# Patient Record
Sex: Female | Born: 1950 | Race: White | Hispanic: No | Smoking: Never smoker
Health system: Southern US, Community
[De-identification: ages and names within clinical notes are randomized; demographics above are authoritative.]

## PROBLEM LIST (undated history)

## (undated) DIAGNOSIS — C541 Malignant neoplasm of endometrium: Secondary | ICD-10-CM

## (undated) DIAGNOSIS — C50919 Malignant neoplasm of unspecified site of unspecified female breast: Secondary | ICD-10-CM

## (undated) DIAGNOSIS — I10 Essential (primary) hypertension: Secondary | ICD-10-CM

## (undated) DIAGNOSIS — M1990 Unspecified osteoarthritis, unspecified site: Secondary | ICD-10-CM

## (undated) HISTORY — DX: Unspecified osteoarthritis, unspecified site: M19.90

## (undated) HISTORY — DX: Essential (primary) hypertension: I10

## (undated) HISTORY — DX: Malignant neoplasm of endometrium: C54.1

## (undated) HISTORY — PX: ABDOMINAL HYSTERECTOMY: SHX81

---

## 2009-02-09 ENCOUNTER — Encounter: Admission: RE | Admit: 2009-02-09 | Discharge: 2009-02-09 | Payer: Self-pay | Admitting: Sports Medicine

## 2010-10-20 ENCOUNTER — Ambulatory Visit: Payer: Self-pay | Admitting: Gynecologic Oncology

## 2010-10-22 ENCOUNTER — Ambulatory Visit: Payer: Self-pay | Admitting: Internal Medicine

## 2010-11-05 ENCOUNTER — Ambulatory Visit: Payer: Self-pay | Admitting: Gynecologic Oncology

## 2010-11-06 LAB — CA 125: CA 125: 28.6 U/mL (ref 0.0–34.0)

## 2010-11-19 ENCOUNTER — Ambulatory Visit: Payer: Self-pay | Admitting: Gynecologic Oncology

## 2010-11-19 DIAGNOSIS — C50919 Malignant neoplasm of unspecified site of unspecified female breast: Secondary | ICD-10-CM

## 2010-11-19 HISTORY — DX: Malignant neoplasm of unspecified site of unspecified female breast: C50.919

## 2010-12-20 ENCOUNTER — Ambulatory Visit: Payer: Self-pay | Admitting: Gynecologic Oncology

## 2010-12-25 ENCOUNTER — Ambulatory Visit: Payer: Self-pay | Admitting: Surgery

## 2010-12-25 HISTORY — PX: BREAST BIOPSY: SHX20

## 2011-01-03 ENCOUNTER — Ambulatory Visit: Payer: Self-pay | Admitting: Surgery

## 2011-01-13 ENCOUNTER — Ambulatory Visit: Payer: Self-pay | Admitting: Surgery

## 2011-01-13 HISTORY — PX: BREAST LUMPECTOMY: SHX2

## 2011-01-15 LAB — PATHOLOGY REPORT

## 2011-01-19 ENCOUNTER — Ambulatory Visit: Payer: Self-pay | Admitting: Gynecologic Oncology

## 2011-02-19 ENCOUNTER — Ambulatory Visit: Payer: Self-pay | Admitting: Gynecologic Oncology

## 2011-03-22 ENCOUNTER — Ambulatory Visit: Payer: Self-pay | Admitting: Gynecologic Oncology

## 2011-04-21 ENCOUNTER — Ambulatory Visit: Payer: Self-pay | Admitting: Gynecologic Oncology

## 2011-05-22 ENCOUNTER — Ambulatory Visit: Payer: Self-pay | Admitting: Gynecologic Oncology

## 2011-06-21 ENCOUNTER — Ambulatory Visit: Payer: Self-pay | Admitting: Gynecologic Oncology

## 2011-07-22 ENCOUNTER — Ambulatory Visit: Payer: Self-pay | Admitting: Gynecologic Oncology

## 2011-07-31 LAB — CBC CANCER CENTER
Basophil %: 0.1 %
HCT: 42.6 % (ref 35.0–47.0)
Lymphocyte #: 0.7 x10 3/mm — ABNORMAL LOW (ref 1.0–3.6)
Lymphocyte %: 13.2 %
MCHC: 34.9 g/dL (ref 32.0–36.0)
Monocyte %: 9.9 %
Neutrophil #: 3.6 x10 3/mm (ref 1.4–6.5)
Platelet: 242 x10 3/mm (ref 150–440)
RDW: 13.9 % (ref 11.5–14.5)
WBC: 4.9 x10 3/mm (ref 3.6–11.0)

## 2011-08-22 ENCOUNTER — Ambulatory Visit: Payer: Self-pay | Admitting: Gynecologic Oncology

## 2011-09-25 ENCOUNTER — Ambulatory Visit: Payer: Self-pay | Admitting: Radiation Oncology

## 2011-10-20 ENCOUNTER — Ambulatory Visit: Payer: Self-pay | Admitting: Oncology

## 2011-11-19 ENCOUNTER — Ambulatory Visit: Payer: Self-pay

## 2011-12-25 ENCOUNTER — Ambulatory Visit: Payer: Self-pay | Admitting: Oncology

## 2012-01-14 ENCOUNTER — Ambulatory Visit: Payer: Self-pay | Admitting: Oncology

## 2012-03-09 ENCOUNTER — Ambulatory Visit: Payer: Self-pay | Admitting: Radiation Oncology

## 2012-03-10 LAB — CANCER ANTIGEN 27.29: CA 27.29: 39.2 U/mL — ABNORMAL HIGH (ref 0.0–38.6)

## 2012-03-21 ENCOUNTER — Ambulatory Visit: Payer: Self-pay | Admitting: Radiation Oncology

## 2012-09-28 ENCOUNTER — Ambulatory Visit: Payer: Self-pay | Admitting: Orthopedic Surgery

## 2012-10-04 ENCOUNTER — Inpatient Hospital Stay: Payer: Self-pay | Admitting: Internal Medicine

## 2012-10-04 LAB — COMPREHENSIVE METABOLIC PANEL
Calcium, Total: 9.9 mg/dL (ref 8.5–10.1)
Chloride: 87 mmol/L — ABNORMAL LOW (ref 98–107)
Co2: 30 mmol/L (ref 21–32)
Creatinine: 1.2 mg/dL (ref 0.60–1.30)
EGFR (African American): 56 — ABNORMAL LOW
EGFR (Non-African Amer.): 48 — ABNORMAL LOW

## 2012-10-04 LAB — BASIC METABOLIC PANEL
BUN: 15 mg/dL (ref 7–18)
Calcium, Total: 9.5 mg/dL (ref 8.5–10.1)
Chloride: 87 mmol/L — ABNORMAL LOW (ref 98–107)
Glucose: 491 mg/dL — ABNORMAL HIGH (ref 65–99)
Osmolality: 280 (ref 275–301)
Sodium: 128 mmol/L — ABNORMAL LOW (ref 136–145)

## 2012-10-04 LAB — URINALYSIS, COMPLETE
Bilirubin,UR: NEGATIVE
Blood: NEGATIVE
Glucose,UR: >=500 mg/dL (ref 0–75)
Leukocyte Esterase: NEGATIVE
Nitrite: NEGATIVE
Ph: 6 (ref 4.5–8.0)
Protein: NEGATIVE
RBC,UR: 10 /HPF (ref 0–5)
Squamous Epithelial: <1
WBC UR: 15 /HPF (ref 0–5)

## 2012-10-04 LAB — CBC
HCT: 41.6 % (ref 35.0–47.0)
MCH: 32.8 pg (ref 26.0–34.0)
MCV: 97 fL (ref 80–100)

## 2012-10-04 LAB — PROTIME-INR: INR: 1.1

## 2012-10-05 LAB — BASIC METABOLIC PANEL
BUN: 13 mg/dL (ref 7–18)
Chloride: 92 mmol/L — ABNORMAL LOW (ref 98–107)
Creatinine: 0.68 mg/dL (ref 0.60–1.30)
EGFR (African American): >60
EGFR (Non-African Amer.): >60
Potassium: 2.9 mmol/L — ABNORMAL LOW (ref 3.5–5.1)
Sodium: 132 mmol/L — ABNORMAL LOW (ref 136–145)

## 2012-10-06 LAB — BASIC METABOLIC PANEL
Co2: 28 mmol/L (ref 21–32)
Creatinine: 0.62 mg/dL (ref 0.60–1.30)
EGFR (Non-African Amer.): >60
Glucose: 248 mg/dL — ABNORMAL HIGH (ref 65–99)
Osmolality: 276 (ref 275–301)
Sodium: 134 mmol/L — ABNORMAL LOW (ref 136–145)

## 2012-10-09 LAB — PLATELET COUNT: Platelet: 325 10*3/uL (ref 150–440)

## 2013-02-08 ENCOUNTER — Ambulatory Visit: Payer: Self-pay | Admitting: Internal Medicine

## 2013-02-18 ENCOUNTER — Ambulatory Visit: Payer: Self-pay | Admitting: Internal Medicine

## 2013-04-12 ENCOUNTER — Ambulatory Visit: Payer: Self-pay | Admitting: Oncology

## 2013-04-20 ENCOUNTER — Ambulatory Visit: Payer: Self-pay | Admitting: Oncology

## 2013-04-25 ENCOUNTER — Ambulatory Visit: Payer: Self-pay | Admitting: Oncology

## 2013-04-25 HISTORY — PX: BREAST BIOPSY: SHX20

## 2013-10-10 ENCOUNTER — Ambulatory Visit: Payer: Self-pay | Admitting: Oncology

## 2013-11-22 ENCOUNTER — Ambulatory Visit: Payer: Self-pay | Admitting: Oncology

## 2013-12-19 ENCOUNTER — Ambulatory Visit: Payer: Self-pay | Admitting: Oncology

## 2014-05-25 ENCOUNTER — Ambulatory Visit: Payer: Self-pay | Admitting: Oncology

## 2014-06-12 IMAGING — US US EXTREM LOW VENOUS*R*
1 series · 14 of 23 positions shown · non-contrast
Comparison: none

REASON FOR EXAM: CALL REPORT [id number] ASK FOR KRUPP swelling red warm to
touch eval DVT   FAX...
COMMENTS:

[Series 1: us extrem low venous*right* · 0.13mm/px · 14 of 23 slices shown]
[im 1/23]
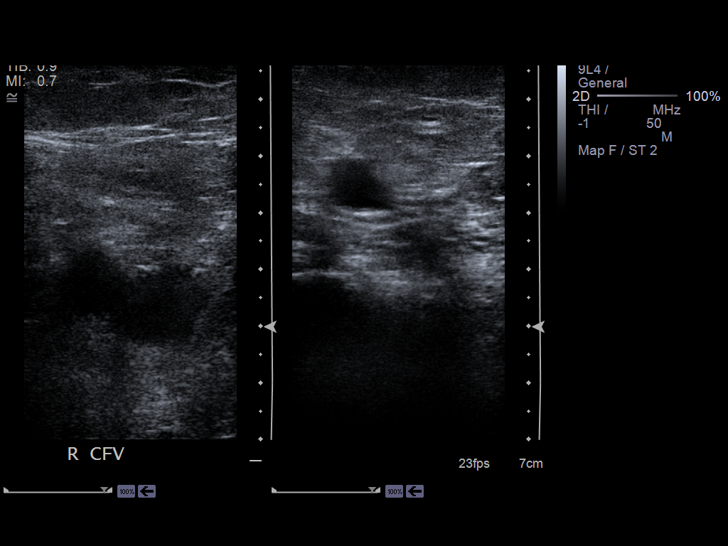
[im 3/23]
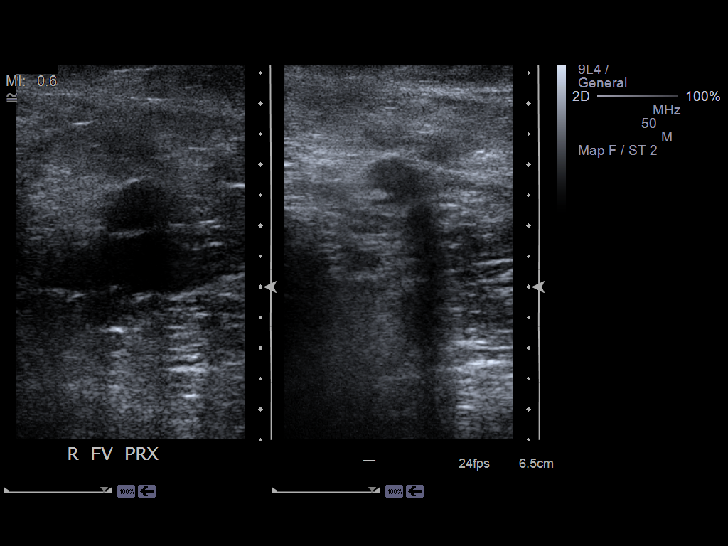
[im 5/23]
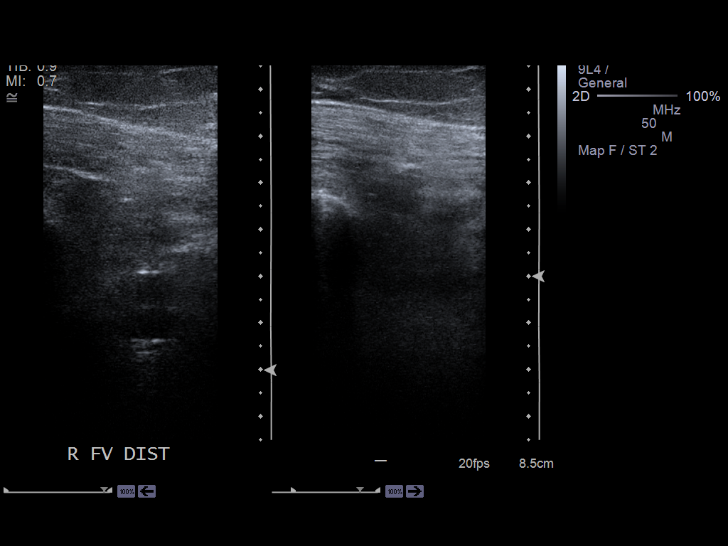
[im 6/23]
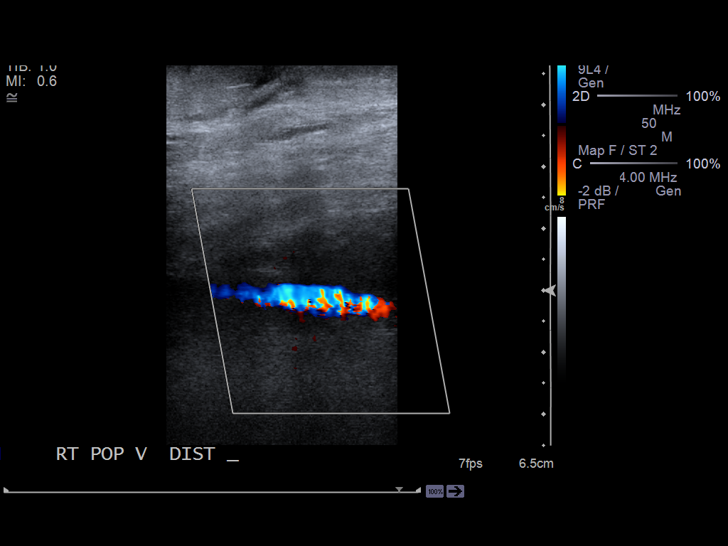
[im 8/23]
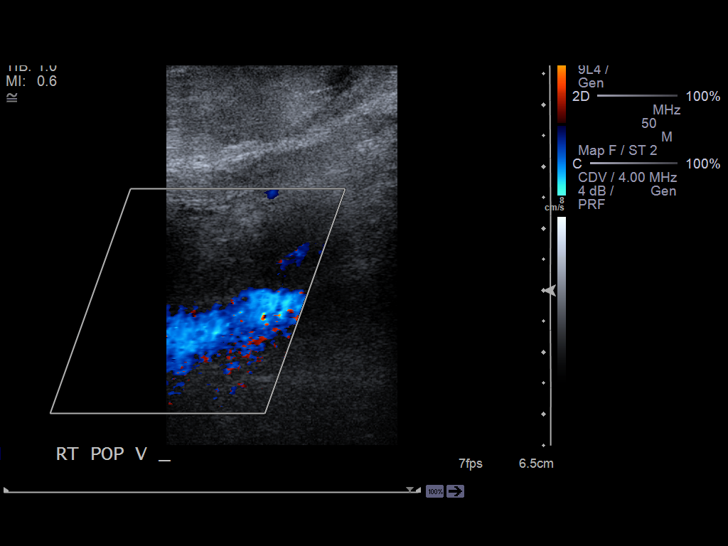
[im 10/23]
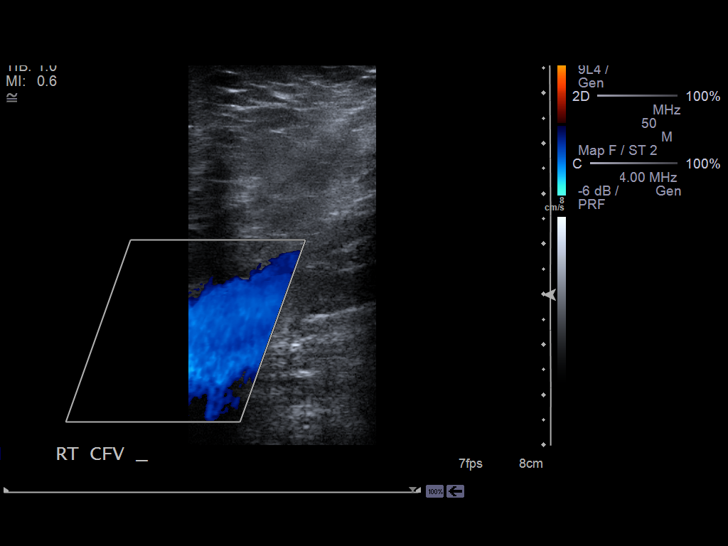
[im 11/23]
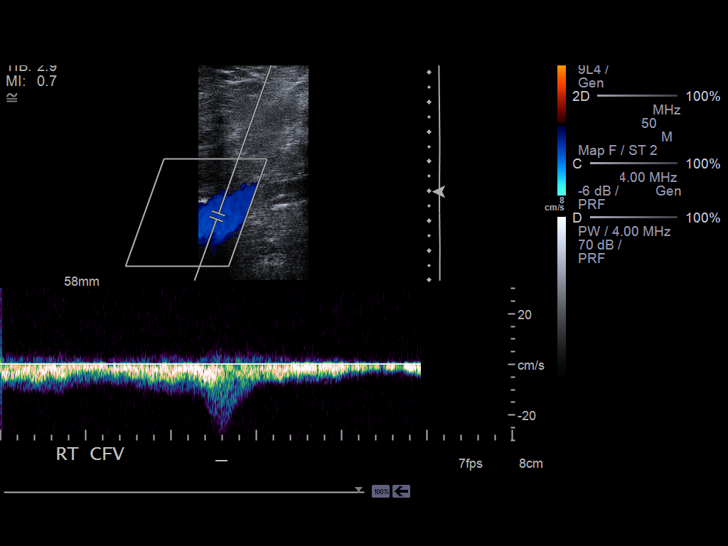
[im 13/23]
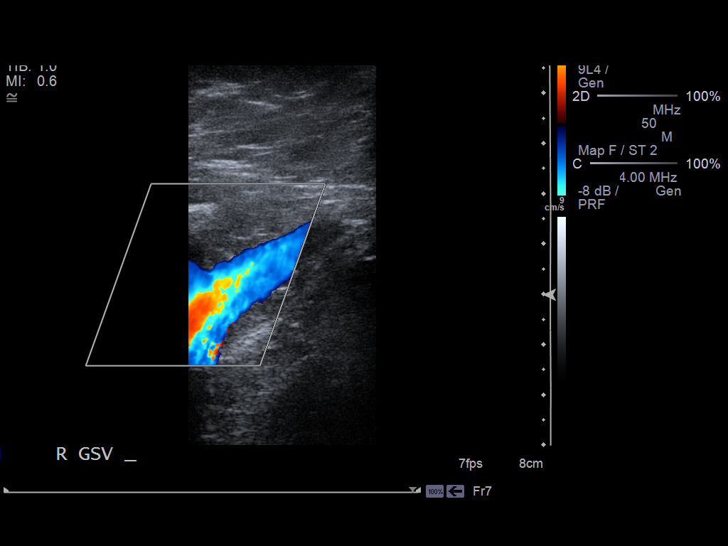
[im 14/23]
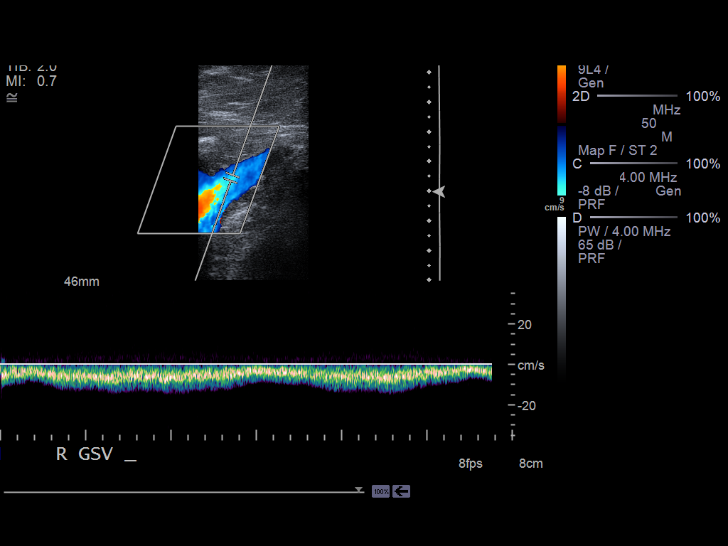
[im 16/23]
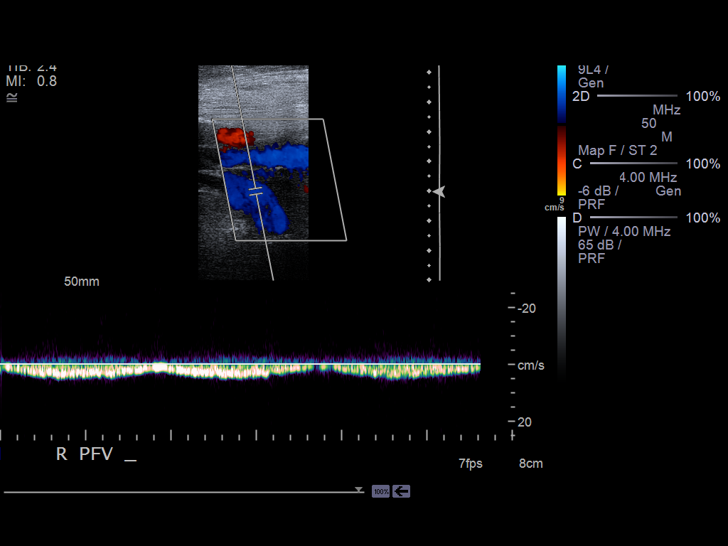
[im 18/23]
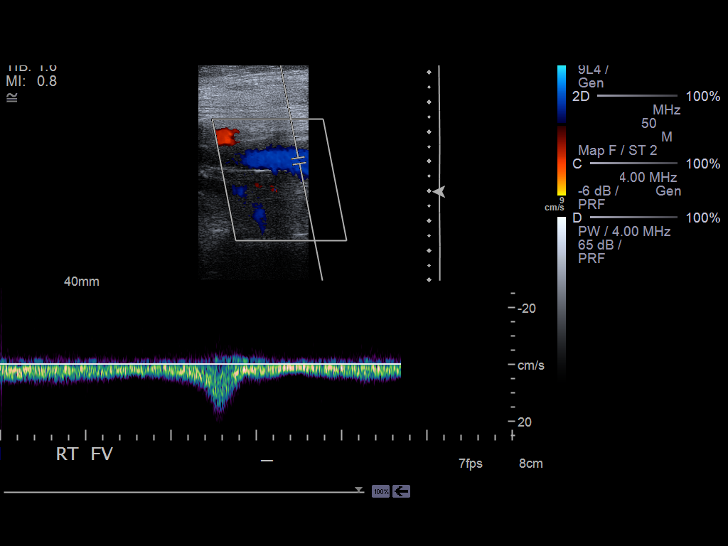
[im 19/23]
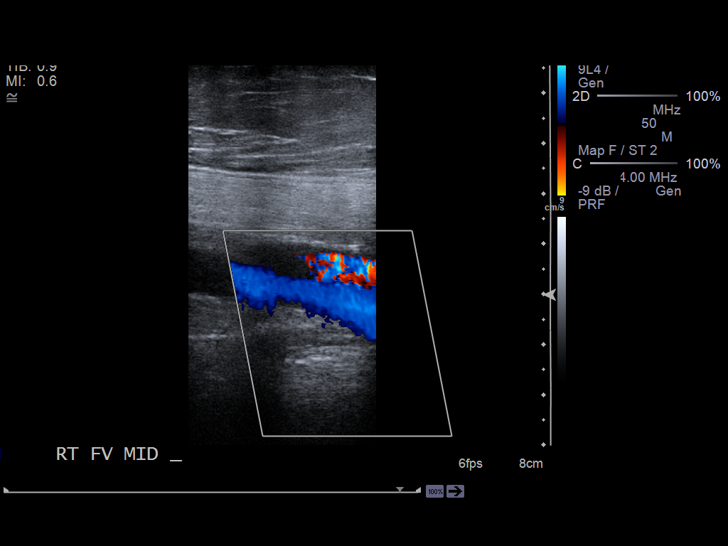
[im 21/23]
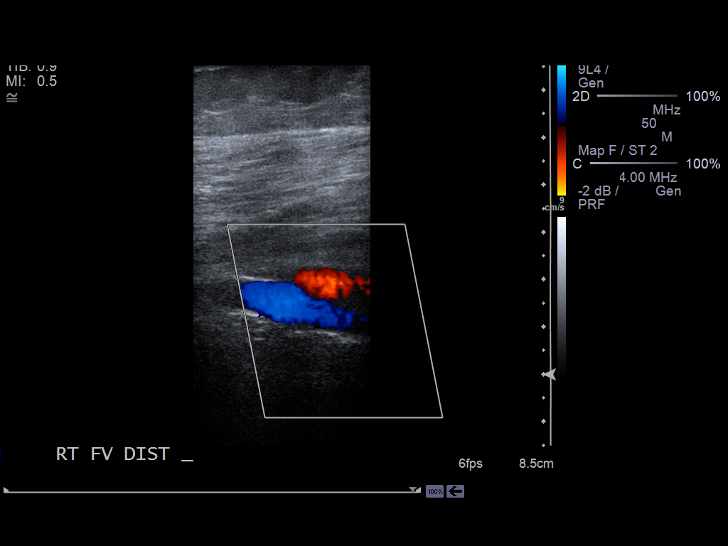
[im 23/23]
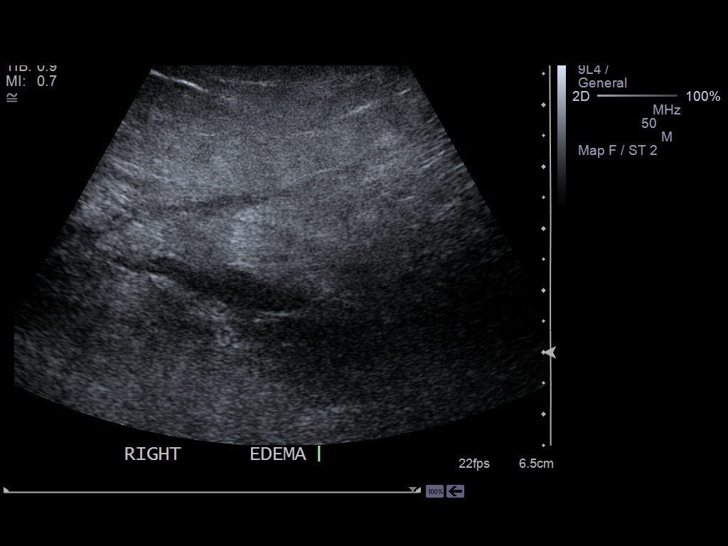

[14 of 23 positions shown; findings below may reference images not displayed]

PROCEDURE:     US  - US DOPPLER LOW EXTR RIGHT  - September 28, 2012  [DATE]

RESULT:     Doppler interrogation of the deep venous system of the right leg
is performed from the common femoral vein through the popliteal vein. The
study shows normal compressibility. The color and spectral Doppler
appearance is normal. There is no abnormal fluid collection.
IMPRESSION: 1. No evidence of right lower extremity deep vein thrombosis.

[REDACTED]

## 2014-11-10 NOTE — H&P (Signed)
PATIENT NAME:  Tina, SIMMERMAN MR#:  [id number] DATE OF BIRTH:  18-Apr-1951  DATE OF ADMISSION:  10/04/2012  PRIMARY CARE PHYSICIAN:  Valentino Saxon, MD  CHIEF COMPLAINT: Dry mouth, increased urination and right leg redness.   HISTORY OF PRESENT ILLNESS: Tina Jacobson is a 64 year old morbidly obese Caucasian female with past medical history of endometrial carcinoma, history of right breast cancer, hypertension and morbid obesity who comes to the Emergency Room accompanied by her son with complaints of increased urination and severe dry mouth with increased fluid intake for the last several days. The patient initially started about 2 weeks ago with a toothache which got resolved and thereafter started on and off with right leg pain and right leg redness. Her redness worsened, more on the right side, about 2 to 3 days ago. She did have some mild fever, not documented at home, comes to the Emergency Room where she was found to be hyperglycemic with blood sugar in the 700s. She was diagnosed with new onset type 2 diabetes and she has a right lower extremity cellulitis and a small cellulitis developing on the left lower extremity. She is being admitted with SIRS secondary to cellulitis and new onset type 2 diabetes. The patient received a dose of IV Rocephin, received some IV fluids and IV insulin. She does not have DKA. She is being admitted for further evaluation and management.   PAST MEDICAL HISTORY: 1.  Hypertension.  2.  Breast cancer, status post right-sided mastectomy and status post radiation.  3.  Endometrial carcinoma status post total abdominal hysterectomy with radiation therapy. The patient says she has been cancer free.   MEDICATIONS: 1.  Metoprolol succinate 12.5 mg p.o. daily.  2.  Letrozole 2.5 mg daily.  3.  Hydrochlorothiazide/triamterene 25/37.5 mg 1 tablet p.o. daily.  4.  Caltrate 1 tablet daily.  5.  CoQ10 p.o. daily.  6.  Multivitamin p.o. daily.   FAMILY HISTORY:  Positive for diabetes on father's side. There is heart disease in the family.    SOCIAL HISTORY: Lives at home. No smoking or use of alcohol.   REVIEW OF SYSTEMS: CONSTITUTIONAL: Positive for fatigue, weakness and fever.  EYES: No blurred or double vision or cataracts.  ENT: No tinnitus, ear pain or hearing loss.  RESPIRATORY: No cough, wheeze, hemoptysis or COPD. CARDIOVASCULAR: No chest pain, orthopnea or edema. Positive for hypertension.  GASTROINTESTINAL: No nausea, vomiting, diarrhea or abdominal pain.  GENITOURINARY: No dysuria or hematuria. ENDOCRINE: Positive for polyuria, increased thirst. No thyroid problems.  HEMATOLOGY: No anemia or easy bruising.  SKIN: Positive for right lower extremity rash, erythema.  MUSCULOSKELETAL: Positive for knee pain.  NEUROLOGIC: No CVA, TIA or dementia.  PSYCH: No anxiety, depression or bipolar disorder. All systems reviewed and negative.   PHYSICAL EXAMINATION: GENERAL: The patient is morbidly obese, about 300 pounds, not in acute distress.  VITAL SIGNS: She is afebrile. Her pulse is 116, respirations are 18, blood pressure is 122/78 and pulse ox is 94% on room air.  HEENT: Atraumatic, normocephalic. Pupils are equal, round and reactive to light and accommodation. EOM intact. Oral mucosa is dry.  NECK: Supple. No JVD. No carotid bruit.  LUNGS: Clear to auscultation bilaterally. No rales, rhonchi, respiratory distress or labored breathing.  HEART: Both the heart sounds are normal. Tachycardia present. No murmur heard. PMI not lateralized. Chest nontender.  EXTREMITIES:  The patient has puffy legs bilaterally with 1+ pitting edema. Right lower extremity has an extensive cellulitis in her right tibial  shin along with left lower extremity mild cellulitis developing on the left lateral aspect of the tibial shin.  ABDOMEN: Obese, soft and nontender. No organomegaly.  NEUROLOGIC: Grossly intact cranial nerves II through XII. No motor or sensory  deficits.  PSYCHIATRIC: The patient is awake, alert and oriented x 3.  SKIN: Warm and dry. Cellulitis in the right lower extremity, as described above.   LABORATORY AND DIAGNOSTICS:  Glucose is 724, BUN 18, creatinine 1.20, sodium 127, potassium 3.3, protein 8.4 and albumin 2.2. CBC is within normal limits. Urinalysis positive for glucosuria and protein is negative.   Ultrasound of lower extremity on the right is negative for DVT.   ASSESSMENT: 64 year old Tina Jacobson with morbid obesity who comes in with:  1.  Systemic antiinflammatory response syndrome due to cellulitis of right lower leg and developing in the left lower leg extremity as well. The patient will be admitted on off-unit medical floor. She will be started on IV cephazolin around the clock. We will follow blood cultures. Followup regression of the cellulitis. Change antibiotics unless the patient's blood cultures grow any organism.  2.  New onset type 2 diabetes. The patient presented with polyuria, polydipsia.  She does not have diabetic ketoacidosis. We will continue to monitor glucose check every 6 hourly and try to treat it with oral metformin 500 mg b.i.d. along with glipizide, which will be started. I will start high-dose sliding scale. If sugars still remain on the higher side secondary to infection, then I will give her some long acting insulin.  3.  Hypertension. Will continue metoprolol for now. The patient will benefit from low-dose enalapril too. We will monitor blood pressure and add ACE inhibitors prior to discharge, if able to.  4.  Morbid obesity. Lifestyle and exercise explained.  5.  Pseudohyponatremia with dehydration. Appears to be due to uncontrolled sugars. The patient is currently on IV fluids, which I will continue at maintenance dose.  6.  Deep vein thrombosis prophylaxis with heparin subcutaneous b.i.d.   Further work-up according to the patient's clinical course. Hospital admission plan was discussed with  the patient and the patient's son who was present in the Emergency Room.   TIME SPENT: 55 minutes.  ____________________________ Hart Rochester Posey Pronto, MD sap:sb D: 10/04/2012 13:49:54 ET T: 10/04/2012 14:15:46 ET JOB#: [id number]  cc: Alquan Morrish A. Posey Pronto, MD, <Dictator> Maureen P. Heber [place], MD Ilda Basset MD ELECTRONICALLY SIGNED 10/16/2012 13:06

## 2014-11-10 NOTE — Discharge Summary (Signed)
PATIENT NAME:  Tina Jacobson, MONDOR MR#:  [id number] DATE OF BIRTH:  1951-05-08  DATE OF ADMISSION:  10/04/2012 DATE OF DISCHARGE:  10/09/2012  PRESENTING COMPLAINT: Dry mouth, increased urination and right leg redness.   DISCHARGE DIAGNOSES:  1.  Right lower extremity cellulitis.  2.  New onset type 2 diabetes.  3.  Morbid obesity.  4.  Hypertension.   CODE STATUS: Full code.   CONDITION ON DISCHARGE: Fair. Vitals stable.   MEDICATIONS:  1.  Hydrochlorothiazide/triamterene 1 tablet daily.  2.  Letrozole 2.5 mg daily.  3.  Motrin 200 mg 1 tablet b.i.d. as needed.  4.  Metoprolol 25 mg extended release 1/2 tablet daily at bedtime.  5.  Glipizide 10 mg b.i.d.  6.  Metformin 1000 mg b.i.d.  7.  Keflex 500 t.i.d. for remaining days as prescribed.  8.  Calcium with vitamin D 1 tablet daily.  9.  Multivitamin daily.  10. Ultram 50 mg, 1 tablet every 4 hours as needed.   Home health PT.   DIET: Diabetic diet, 1800 calorie, low sodium.   FOLLOWUP: Follow up with Dr. Heber  in 1 to 2 weeks. The patient will be followed up with [facility] for outpatient diabetes management.   LABS AT DISCHARGE: Glucose is 116. Platelet count 325. Ultrasound Doppler right lower extremity with no clot. Creatinine 0.62, sodium 134, potassium 3.6, chloride 97, bicarbonate 28. Hemoglobin A1c is 13.0. Magnesium 2.0. Blood cultures negative in 24 hours. Blood glucose on admission was 724, anion gap was normal. CBC within normal limits. Urinalysis negative for urinary tract infection.  BRIEF SUMMARY OF HOSPITAL COURSE: The patient is a very pleasant, morbidly obese, 64 year old, Caucasian female with history of hypertension, history of breast cancer, history of endometrial cancer, who comes to the hospital due to right lower extremity redness and noted to have cellulitis along with new onset diabetes.  1.  She was admitted with SIRS, which was likely due to her right lower extremity cellulitis. The patient  was given IV antibiotics. She was started on IV Keflex and remained afebrile and remained hemodynamically stable. Her blood cultures remained negative. She was changed to p.o. Keflex, she has completed treatment for a total of 10 days.  2.  New onset diabetes. The patient presented with symptoms of polyuria, polydipsia, and had blood sugar of greater than 700 with normal anion gap. Sugars improved after she was started on metformin, glipizide and sliding scale. She was seen by diabetic inpatient [facility] and was educated regarding type 2 diabetes. She will follow with them as outpatient. Referral has been made for outpatient followup.  3.  Hypertension. Toprol was continued.  4.  Hyponatremia. Resolves since blood sugars have improved. It appeared to be pseudohyponatremia secondary to elevated sugars.  4.  Hypokalemia, resolved with p.o. supplementation.   Physical therapy and home health R.N. has been set up. Hospital stay otherwise remained stable.   CODE STATUS: The patient remained a full code.   TIME SPENT: 40 minutes.    ____________________________ Hart Rochester Posey Pronto, MD sap:aw D: 10/10/2012 07:11:08 ET T: 10/10/2012 08:43:54 ET JOB#: [id number]  cc: Jayelle Page A. Posey Pronto, MD, <Dictator> Maureen P. Heber , MD Ilda Basset MD ELECTRONICALLY SIGNED 10/16/2012 13:06

## 2014-12-14 ENCOUNTER — Encounter: Payer: Self-pay | Admitting: Oncology

## 2014-12-14 ENCOUNTER — Inpatient Hospital Stay: Payer: Medicare Other | Attending: Oncology | Admitting: Oncology

## 2014-12-14 ENCOUNTER — Encounter (INDEPENDENT_AMBULATORY_CARE_PROVIDER_SITE_OTHER): Payer: Self-pay

## 2014-12-14 VITALS — BP 155/89 | HR 97 | Temp 98.3°F | Resp 20 | Wt 303.1 lb

## 2014-12-14 DIAGNOSIS — I10 Essential (primary) hypertension: Secondary | ICD-10-CM | POA: Diagnosis not present

## 2014-12-14 DIAGNOSIS — Z170 Estrogen receptor positive status [ER+]: Secondary | ICD-10-CM | POA: Insufficient documentation

## 2014-12-14 DIAGNOSIS — M1990 Unspecified osteoarthritis, unspecified site: Secondary | ICD-10-CM

## 2014-12-14 DIAGNOSIS — Z8542 Personal history of malignant neoplasm of other parts of uterus: Secondary | ICD-10-CM | POA: Insufficient documentation

## 2014-12-14 DIAGNOSIS — C50911 Malignant neoplasm of unspecified site of right female breast: Secondary | ICD-10-CM | POA: Insufficient documentation

## 2014-12-14 DIAGNOSIS — Z79811 Long term (current) use of aromatase inhibitors: Secondary | ICD-10-CM | POA: Diagnosis not present

## 2014-12-14 DIAGNOSIS — Z79899 Other long term (current) drug therapy: Secondary | ICD-10-CM

## 2014-12-14 DIAGNOSIS — Z923 Personal history of irradiation: Secondary | ICD-10-CM | POA: Diagnosis not present

## 2014-12-14 DIAGNOSIS — Z90710 Acquired absence of both cervix and uterus: Secondary | ICD-10-CM | POA: Insufficient documentation

## 2014-12-14 MED ORDER — LETROZOLE 2.5 MG PO TABS: 2.5000 mg | ORAL_TABLET | Freq: Every day | ORAL | Status: DC

## 2015-01-01 DIAGNOSIS — C50211 Malignant neoplasm of upper-inner quadrant of right female breast: Secondary | ICD-10-CM | POA: Insufficient documentation

## 2015-01-01 NOTE — Progress Notes (Signed)
[place]  [phone] [phone]) [phone]  ID: Tina Jacobson OB: [birth year]  MR#: [id number]  AOZ#:[id number]  Patient Care Team: Gayland Curry, MD as PCP - General (Family Medicine)  CHIEF COMPLAINT:  Chief Complaint  Patient presents with  . Follow-up    breast cancer/endometrial cancer    INTERVAL HISTORY: Patient returns to clinic for routine six-month followup.  She continues to take letrozole without significant side effects, although does complain of occasional hot flashes.  She has no neurologic complaints.  She denies any recent fevers.  She has a good appetite and denies weight loss.  She has no chest pain or shortness of breath.  She denies any nausea, vomiting, constipation, or diarrhea.  Patient offers no further specific complaints today.   REVIEW OF SYSTEMS:   Review of Systems  Constitutional: Negative.   Cardiovascular: Negative.   Musculoskeletal: Negative.     As per HPI. Otherwise, a complete review of systems is negatve.  PAST MEDICAL HISTORY: Past Medical History  Diagnosis Date  . Hypertension   . Arthritis   . Endometrial carcinoma     PAST SURGICAL HISTORY: Past Surgical History  Procedure Laterality Date  . Abdominal hysterectomy      FAMILY HISTORY No family history on file.     ADVANCED DIRECTIVES:    HEALTH MAINTENANCE: History  Substance Use Topics  . Smoking status: Never Smoker   . Smokeless tobacco: Never Used  . Alcohol Use: No     Colonoscopy:  PAP:  Bone density:  Lipid panel:  Allergies  Allergen Reactions  . Codeine Nausea Only  . Lisinopril Other (See Comments)    Blood pressure dropped to low 02/2010 on a higher dose    Current Outpatient Prescriptions  Medication Sig Dispense Refill  . atorvastatin (LIPITOR) 10 MG tablet   10  . Calcium Carbonate-Vitamin D 600-400 MG-UNIT per tablet Take by mouth.    . letrozole (FEMARA) 2.5 MG tablet Take 1 tablet (2.5 mg total) by  mouth daily. 90 tablet 4  . metFORMIN (GLUCOPHAGE) 1000 MG tablet   0  . metoprolol succinate (TOPROL-XL) 25 MG 24 hr tablet   7  . ramipril (ALTACE) 1.25 MG capsule   10  . triamterene-hydrochlorothiazide (MAXZIDE-25) 37.5-25 MG per tablet Take by mouth.     No current facility-administered medications for this visit.    OBJECTIVE: Filed Vitals:   12/14/14 1611  BP: 155/89  Pulse: 97  Temp: 98.3 F (36.8 C)  Resp: 20     There is no height on file to calculate BMI.    ECOG FS:0 - Asymptomatic  General: Well-developed, well-nourished, no acute distress. Eyes: anicteric sclera. Breasts: Bilateral breasts and axilla without lumps or masses. Lungs: Clear to auscultation bilaterally. Heart: Regular rate and rhythm. No rubs, murmurs, or gallops. Abdomen: Soft, nontender, nondistended. No organomegaly noted, normoactive bowel sounds. Musculoskeletal: No edema, cyanosis, or clubbing. Neuro: Alert, answering all questions appropriately. Cranial nerves grossly intact. Skin: No rashes or petechiae noted. Psych: Normal affect.   LAB RESULTS:  Lab Results  Component Value Date   NA 134* 10/06/2012   K 3.6 10/06/2012   CL 97* 10/06/2012   CO2 28 10/06/2012   GLUCOSE 248* 10/06/2012   BUN 10 10/06/2012   CREATININE 0.62 10/06/2012   CALCIUM 8.5 10/06/2012   PROT 8.4* 10/04/2012   ALBUMIN 2.2* 10/04/2012   AST 19 10/04/2012   ALT 23 10/04/2012   ALKPHOS 136 10/04/2012   GFRNONAA >60  10/06/2012   GFRAA >60 10/06/2012    Lab Results  Component Value Date   WBC 7.4 10/04/2012   NEUTROABS 3.6 07/31/2011   HGB 14.1 10/04/2012   HCT 41.6 10/04/2012   MCV 97 10/04/2012   PLT 325 10/09/2012     STUDIES: No results found.  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  PLAN:    1.  Breast cancer: Given patient's Oncotype DX score of 5, she was at low risk for recurrence and did not require adjuvant  chemotherapy.  Continue letrozole daily for 5 years completing in January 2018.  Patient had an abnormal mammogram in October 2015, but biopsy was negative for malignancy. Repeat mammogram in October 2016. Bone mineral density completed on Nov 28, 2013 was reported as normal with a T score of -0.1. Repeat in May 2017. Return toclinic in 6 months for routine followup.   2.  Endometrial cancer: Patient has completed her XRT, monitor.  Continue followup with Gyn-Onc as needed.   Patient expressed understanding and was in agreement with this plan. She also understands that She can call clinic at any time with any questions, concerns, or complaints.   Breast cancer   Staging form: Breast, AJCC 7th Edition     Clinical stage from 01/01/2015: Stage IA (T1c, N0, M0) - Signed by Lloyd Huger, MD on 01/01/2015   Lloyd Huger, MD   01/01/2015 12:16 PM

## 2015-01-04 ENCOUNTER — Ambulatory Visit
Admission: RE | Admit: 2015-01-04 | Discharge: 2015-01-04 | Disposition: A | Discharge status: Home / Self Care | Payer: Medicare Other | Source: Ambulatory Visit | Attending: Oncology | Admitting: Oncology

## 2015-01-04 ENCOUNTER — Ambulatory Visit: Payer: BC Managed Care – PPO

## 2015-01-04 ENCOUNTER — Other Ambulatory Visit: Payer: Self-pay | Admitting: Oncology

## 2015-01-04 DIAGNOSIS — R921 Mammographic calcification found on diagnostic imaging of breast: Secondary | ICD-10-CM | POA: Diagnosis not present

## 2015-01-04 DIAGNOSIS — Z853 Personal history of malignant neoplasm of breast: Secondary | ICD-10-CM | POA: Insufficient documentation

## 2015-01-04 DIAGNOSIS — C50911 Malignant neoplasm of unspecified site of right female breast: Secondary | ICD-10-CM

## 2015-01-04 HISTORY — DX: Malignant neoplasm of unspecified site of unspecified female breast: C50.919

## 2015-06-18 ENCOUNTER — Inpatient Hospital Stay: Payer: Medicare Other | Admitting: Oncology

## 2015-06-28 ENCOUNTER — Inpatient Hospital Stay: Payer: Medicare Other | Admitting: Oncology

## 2015-12-24 ENCOUNTER — Other Ambulatory Visit: Payer: Self-pay | Admitting: *Deleted

## 2015-12-24 DIAGNOSIS — C50911 Malignant neoplasm of unspecified site of right female breast: Secondary | ICD-10-CM

## 2015-12-24 MED ORDER — LETROZOLE 2.5 MG PO TABS: 2.5000 mg | ORAL_TABLET | Freq: Every day | ORAL | Status: DC

## 2016-01-14 ENCOUNTER — Inpatient Hospital Stay: Payer: Medicare Other | Admitting: Oncology

## 2016-01-31 ENCOUNTER — Ambulatory Visit: Payer: Self-pay | Admitting: Oncology

## 2016-02-17 NOTE — Progress Notes (Signed)
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  INTERVAL HISTORY: Patient has not been evaluated in clinic in over 1 year. She has some mild tenderness in her right axilla at the site of her surgery, but otherwise feels well. She continues to tolerate letrozole, but does complain of occasional hot flashes.  She has no neurologic complaints.  She denies any recent fevers.  She has a good appetite and denies weight loss.  She has no chest pain or shortness of breath.  She denies any nausea, vomiting, constipation, or diarrhea.  Patient offers no further specific complaints today.   REVIEW OF SYSTEMS:   Review of Systems  Constitutional: Negative.  Negative for fever, malaise/fatigue and weight loss.  Respiratory: Negative.  Negative for cough and shortness of breath.   Cardiovascular: Negative.  Negative for chest pain.  Gastrointestinal: Negative.  Negative for abdominal pain.  Genitourinary: Negative.   Musculoskeletal: Negative.   Neurological: Negative.  Negative for weakness.  Psychiatric/Behavioral: Negative.     As per HPI. Otherwise, a complete review of systems is negatve.  PAST MEDICAL HISTORY: Past Medical History:  Diagnosis Date  . Arthritis   . Breast cancer 11/2010   radiation and lumpectomy  . Endometrial carcinoma   . Hypertension     PAST SURGICAL HISTORY: Past Surgical History:  Procedure Laterality Date  . ABDOMINAL HYSTERECTOMY    . BREAST BIOPSY Right 04/25/2013   negative, stereotactic  . BREAST BIOPSY Right 12/25/2010   positive, ultrasound core  . BREAST LUMPECTOMY Right 01/13/2011   positive    FAMILY HISTORY Family History    Problem Relation Age of Onset  . Breast cancer Cousin 51    paternal  . Breast cancer Cousin 93    paternal       ADVANCED DIRECTIVES:    HEALTH MAINTENANCE: Social History  Substance Use Topics  . Smoking status: Never Smoker  . Smokeless tobacco: Never Used  . Alcohol use No     Colonoscopy:  PAP:  Bone density:  Lipid panel:  Allergies  Allergen Reactions  . Codeine Nausea Only  . Lisinopril Other (See Comments)    Blood pressure dropped to low 02/2010 on a higher dose    Current Outpatient Prescriptions  Medication Sig Dispense Refill  . atorvastatin (LIPITOR) 10 MG tablet   10  . Calcium Carbonate-Vitamin D 600-400 MG-UNIT per tablet Take by mouth.    . letrozole (FEMARA) 2.5 MG tablet Take 1 tablet (2.5 mg total) by mouth daily. 30 tablet 0  . metFORMIN (GLUCOPHAGE) 1000 MG tablet   0  . metoprolol succinate (TOPROL-XL) 25 MG 24 hr tablet   7  . ramipril (ALTACE) 1.25 MG capsule   10  . triamterene-hydrochlorothiazide (MAXZIDE-25) 37.5-25 MG per tablet Take by mouth.     No current facility-administered medications for this visit.     OBJECTIVE: There were no vitals filed for this visit.   There is no height or weight on file to calculate BMI.    ECOG FS:0 - Asymptomatic  General: Well-developed, well-nourished, no acute distress. Eyes: anicteric sclera. Breasts: Bilateral breasts and axilla without lumps or masses. Lungs: Clear to auscultation bilaterally. Heart: Regular rate and rhythm. No  rubs, murmurs, or gallops. Abdomen: Soft, nontender, nondistended. No organomegaly noted, normoactive bowel sounds. Musculoskeletal: No edema, cyanosis, or clubbing. Neuro: Alert, answering all questions appropriately. Cranial nerves grossly intact. Skin: No rashes or petechiae noted. Psych: Normal affect.   LAB RESULTS:  Lab Results  Component Value Date   NA 134 (L) 10/06/2012   K 3.6 10/06/2012   CL 97 (L) 10/06/2012   CO2 28 10/06/2012   GLUCOSE 248  (H) 10/06/2012   BUN 10 10/06/2012   CREATININE 0.62 10/06/2012   CALCIUM 8.5 10/06/2012   PROT 8.4 (H) 10/04/2012   ALBUMIN 2.2 (L) 10/04/2012   AST 19 10/04/2012   ALT 23 10/04/2012   ALKPHOS 136 10/04/2012   BILITOT 0.6 10/04/2012   GFRNONAA >60 10/06/2012   GFRAA >60 10/06/2012    Lab Results  Component Value Date   WBC 7.4 10/04/2012   NEUTROABS 3.6 07/31/2011   HGB 14.1 10/04/2012   HCT 41.6 10/04/2012   MCV 97 10/04/2012   PLT 325 10/09/2012     STUDIES: No results found.  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  PLAN:    1.  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  Continue letrozole daily for 5 years completing in January 2018.  Patient had an abnormal mammogram in October 2015, but biopsy was negative for malignancy. Patient will require repeat mammogram in the next 1-2 weeks. Return to clinic in 6 months for routine evaluation. 2. Postmenopausal: Bone mineral density completed on Nov 28, 2013 was reported as normal with a T score of -0.1. Repeat the next 1-2 weeks.   2.  Endometrial cancer: Patient has completed her XRT, monitor.  Continue followup with Gyn-Onc as needed.   Patient expressed understanding and was in agreement with this plan. She also understands that She can call clinic at any time with any questions, concerns, or complaints.   Breast cancer   Staging form: Breast, AJCC 7th Edition     Clinical stage from 01/01/2015: Stage IA (T1c, N0, M0) - Signed by Lloyd Huger, MD on 01/01/2015   Lloyd Huger, MD   02/17/2016 3:20 PM

## 2016-02-18 ENCOUNTER — Inpatient Hospital Stay: Payer: Medicare Other | Attending: Oncology | Admitting: Oncology

## 2016-02-18 ENCOUNTER — Encounter: Payer: Self-pay | Admitting: Oncology

## 2016-02-18 VITALS — BP 120/83 | HR 87 | Temp 95.6°F | Ht 61.0 in | Wt 303.6 lb

## 2016-02-18 DIAGNOSIS — Z923 Personal history of irradiation: Secondary | ICD-10-CM | POA: Insufficient documentation

## 2016-02-18 DIAGNOSIS — M1990 Unspecified osteoarthritis, unspecified site: Secondary | ICD-10-CM | POA: Insufficient documentation

## 2016-02-18 DIAGNOSIS — R232 Flushing: Secondary | ICD-10-CM | POA: Diagnosis not present

## 2016-02-18 DIAGNOSIS — C50211 Malignant neoplasm of upper-inner quadrant of right female breast: Secondary | ICD-10-CM | POA: Insufficient documentation

## 2016-02-18 DIAGNOSIS — Z780 Asymptomatic menopausal state: Secondary | ICD-10-CM | POA: Diagnosis not present

## 2016-02-18 DIAGNOSIS — Z79811 Long term (current) use of aromatase inhibitors: Secondary | ICD-10-CM | POA: Insufficient documentation

## 2016-02-18 DIAGNOSIS — M79621 Pain in right upper arm: Secondary | ICD-10-CM | POA: Insufficient documentation

## 2016-02-18 DIAGNOSIS — Z7984 Long term (current) use of oral hypoglycemic drugs: Secondary | ICD-10-CM

## 2016-02-18 DIAGNOSIS — Z79899 Other long term (current) drug therapy: Secondary | ICD-10-CM | POA: Diagnosis not present

## 2016-02-18 DIAGNOSIS — Z90710 Acquired absence of both cervix and uterus: Secondary | ICD-10-CM | POA: Diagnosis not present

## 2016-02-18 DIAGNOSIS — Z170 Estrogen receptor positive status [ER+]: Secondary | ICD-10-CM | POA: Diagnosis not present

## 2016-02-18 DIAGNOSIS — I10 Essential (primary) hypertension: Secondary | ICD-10-CM | POA: Insufficient documentation

## 2016-02-18 DIAGNOSIS — Z8542 Personal history of malignant neoplasm of other parts of uterus: Secondary | ICD-10-CM | POA: Insufficient documentation

## 2016-02-18 DIAGNOSIS — Z803 Family history of malignant neoplasm of breast: Secondary | ICD-10-CM | POA: Diagnosis not present

## 2016-02-18 NOTE — Progress Notes (Signed)
Breast concerns are related to a firm tissue under right arm.  Left breast seems more enlarged than before.  SOB on exertion

## 2016-02-25 ENCOUNTER — Other Ambulatory Visit: Payer: Self-pay | Admitting: *Deleted

## 2016-02-25 DIAGNOSIS — C50911 Malignant neoplasm of unspecified site of right female breast: Secondary | ICD-10-CM

## 2016-02-25 MED ORDER — LETROZOLE 2.5 MG PO TABS: 2.5000 mg | ORAL_TABLET | Freq: Every day | ORAL | 1 refills | Status: DC

## 2016-03-11 ENCOUNTER — Other Ambulatory Visit: Payer: Self-pay | Admitting: *Deleted

## 2016-03-11 DIAGNOSIS — C50911 Malignant neoplasm of unspecified site of right female breast: Secondary | ICD-10-CM

## 2016-03-11 MED ORDER — LETROZOLE 2.5 MG PO TABS: 2.5000 mg | ORAL_TABLET | Freq: Every day | ORAL | 0 refills | Status: DC

## 2016-03-12 ENCOUNTER — Other Ambulatory Visit: Payer: Medicare Other

## 2016-03-12 ENCOUNTER — Ambulatory Visit: Payer: Medicare Other

## 2016-03-18 ENCOUNTER — Ambulatory Visit
Admission: RE | Admit: 2016-03-18 | Discharge: 2016-03-18 | Disposition: A | Discharge status: Home / Self Care | Payer: Medicare Other | Source: Ambulatory Visit | Attending: Oncology | Admitting: Oncology

## 2016-03-18 DIAGNOSIS — C50211 Malignant neoplasm of upper-inner quadrant of right female breast: Secondary | ICD-10-CM | POA: Insufficient documentation

## 2016-03-18 DIAGNOSIS — Z79818 Long term (current) use of other agents affecting estrogen receptors and estrogen levels: Secondary | ICD-10-CM | POA: Insufficient documentation

## 2016-03-18 DIAGNOSIS — Z780 Asymptomatic menopausal state: Secondary | ICD-10-CM | POA: Insufficient documentation

## 2016-03-31 ENCOUNTER — Encounter: Payer: Self-pay | Admitting: *Deleted

## 2016-03-31 NOTE — Progress Notes (Signed)
Called patient per Dr. Gary Fleet request.  Patient's mammogram with birads 0, recommending a CT or MRI for further evaluation of an axillary lipoma like lesion.  Discussed results with patient.  States the thickness in the axilla has been present since her partial mastectomy.  She does not believe anything has changed.  States Dr. Joneen Caraway reviewed possibilty of "cancer or pre-cancer" might show up on the scans.  States she is confident in waiting 6 months for her follow-up exam with Dr. Grayland Ormond.  She will continue with monthly self exam and will call if any new findings or changes.  She is to call with any questions or needs.    Oncology Nurse Navigator Documentation  Navigator Location: [facility] (03/31/16 1000) Navigator Encounter Type: Telephone (03/31/16 1000) Telephone: Outgoing Call (03/31/16 1000)                                        Time Spent with Patient: 30 (03/31/16 1000)

## 2016-06-16 ENCOUNTER — Other Ambulatory Visit: Payer: Self-pay | Admitting: *Deleted

## 2016-06-16 MED ORDER — LETROZOLE 2.5 MG PO TABS: 2.5000 mg | ORAL_TABLET | Freq: Every day | ORAL | 0 refills | Status: DC

## 2016-08-24 NOTE — Progress Notes (Deleted)
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  INTERVAL HISTORY: Patient has not been evaluated in clinic in over 1 year. She has some mild tenderness in her right axilla at the site of her surgery, but otherwise feels well. She continues to tolerate letrozole, but does complain of occasional hot flashes.  She has no neurologic complaints.  She denies any recent fevers.  She has a good appetite and denies weight loss.  She has no chest pain or shortness of breath.  She denies any nausea, vomiting, constipation, or diarrhea.  Patient offers no further specific complaints today.   REVIEW OF SYSTEMS:   Review of Systems  Constitutional: Negative.  Negative for fever, malaise/fatigue and weight loss.  Respiratory: Negative.  Negative for cough and shortness of breath.   Cardiovascular: Negative.  Negative for chest pain.  Gastrointestinal: Negative.  Negative for abdominal pain.  Genitourinary: Negative.   Musculoskeletal: Negative.   Neurological: Negative.  Negative for weakness.  Psychiatric/Behavioral: Negative.     As per HPI. Otherwise, a complete review of systems is negatve.  PAST MEDICAL HISTORY: Past Medical History:  Diagnosis Date  . Arthritis   . Breast cancer ([place]) 11/2010   radiation and lumpectomy  . Endometrial carcinoma ([place])   . Hypertension     PAST SURGICAL HISTORY: Past Surgical History:  Procedure Laterality Date  . ABDOMINAL HYSTERECTOMY    . BREAST BIOPSY Right 04/25/2013   negative, stereotactic  . BREAST BIOPSY Right 12/25/2010   positive, ultrasound core  . BREAST LUMPECTOMY Right 01/13/2011   positive    FAMILY  HISTORY Family History  Problem Relation Age of Onset  . Breast cancer Cousin 70    paternal  . Breast cancer Cousin 27    paternal       ADVANCED DIRECTIVES:    HEALTH MAINTENANCE: Social History  Substance Use Topics  . Smoking status: Never Smoker  . Smokeless tobacco: Never Used  . Alcohol use No     Colonoscopy:  PAP:  Bone density:  Lipid panel:  Allergies  Allergen Reactions  . Codeine Nausea Only  . Lisinopril Other (See Comments)    Blood pressure dropped to low 02/2010 on a higher dose    Current Outpatient Prescriptions  Medication Sig Dispense Refill  . atorvastatin (LIPITOR) 10 MG tablet   10  . buPROPion (WELLBUTRIN XL) 150 MG 24 hr tablet Take by mouth.    . Calcium Carbonate-Vitamin D 600-400 MG-UNIT per tablet Take by mouth.    . letrozole (FEMARA) 2.5 MG tablet Take 1 tablet (2.5 mg total) by mouth daily. 90 tablet 0  . metFORMIN (GLUCOPHAGE) 1000 MG tablet   0  . metoprolol succinate (TOPROL-XL) 25 MG 24 hr tablet   7  . ramipril (ALTACE) 1.25 MG capsule   10  . triamterene-hydrochlorothiazide (MAXZIDE-25) 37.5-25 MG per tablet Take by mouth.     No current facility-administered medications for this visit.     OBJECTIVE: There were no vitals filed for this visit.   There is no height or weight on file to calculate BMI.    ECOG FS:0 - Asymptomatic  General: Well-developed, well-nourished, no acute distress. Eyes: anicteric sclera. Breasts: Bilateral breasts and  axilla without lumps or masses. Lungs: Clear to auscultation bilaterally. Heart: Regular rate and rhythm. No rubs, murmurs, or gallops. Abdomen: Soft, nontender, nondistended. No organomegaly noted, normoactive bowel sounds. Musculoskeletal: No edema, cyanosis, or clubbing. Neuro: Alert, answering all questions appropriately. Cranial nerves grossly intact. Skin: No rashes or petechiae noted. Psych: Normal affect.   LAB RESULTS:  Lab Results  Component Value Date   NA 134 (L)  10/06/2012   K 3.6 10/06/2012   CL 97 (L) 10/06/2012   CO2 28 10/06/2012   GLUCOSE 248 (H) 10/06/2012   BUN 10 10/06/2012   CREATININE 0.62 10/06/2012   CALCIUM 8.5 10/06/2012   PROT 8.4 (H) 10/04/2012   ALBUMIN 2.2 (L) 10/04/2012   AST 19 10/04/2012   ALT 23 10/04/2012   ALKPHOS 136 10/04/2012   BILITOT 0.6 10/04/2012   GFRNONAA >60 10/06/2012   GFRAA >60 10/06/2012    Lab Results  Component Value Date   WBC 7.4 10/04/2012   NEUTROABS 3.6 07/31/2011   HGB 14.1 10/04/2012   HCT 41.6 10/04/2012   MCV 97 10/04/2012   PLT 325 10/09/2012     STUDIES: No results found.  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  PLAN:    1.  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  Continue letrozole daily for 5 years completing in January 2018.  Patient had an abnormal mammogram in October 2015, but biopsy was negative for malignancy. Patient will require repeat mammogram in the next 1-2 weeks. Return to clinic in 6 months for routine evaluation. 2. Postmenopausal: Bone mineral density completed on Nov 28, 2013 was reported as normal with a T score of -0.1. Repeat the next 1-2 weeks.   3.  Endometrial cancer: Patient has completed her XRT, monitor.  Continue followup with Gyn-Onc as needed.   Patient expressed understanding and was in agreement with this plan. She also understands that She can call clinic at any time with any questions, concerns, or complaints.   Breast cancer   Staging form: Breast, AJCC 7th Edition     Clinical stage from 01/01/2015: Stage IA (T1c, N0, M0) - Signed by Lloyd Huger, MD on 01/01/2015   Lloyd Huger, MD   08/24/2016 11:31 PM

## 2016-08-25 ENCOUNTER — Inpatient Hospital Stay: Payer: Medicare Other | Admitting: Oncology

## 2016-09-10 ENCOUNTER — Other Ambulatory Visit: Payer: Self-pay | Admitting: Oncology

## 2016-09-24 NOTE — Progress Notes (Deleted)
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  INTERVAL HISTORY: Patient has not been evaluated in clinic in over 1 year. She has some mild tenderness in her right axilla at the site of her surgery, but otherwise feels well. She continues to tolerate letrozole, but does complain of occasional hot flashes.  She has no neurologic complaints.  She denies any recent fevers.  She has a good appetite and denies weight loss.  She has no chest pain or shortness of breath.  She denies any nausea, vomiting, constipation, or diarrhea.  Patient offers no further specific complaints today.   REVIEW OF SYSTEMS:   Review of Systems  Constitutional: Negative.  Negative for fever, malaise/fatigue and weight loss.  Respiratory: Negative.  Negative for cough and shortness of breath.   Cardiovascular: Negative.  Negative for chest pain.  Gastrointestinal: Negative.  Negative for abdominal pain.  Genitourinary: Negative.   Musculoskeletal: Negative.   Neurological: Negative.  Negative for weakness.  Psychiatric/Behavioral: Negative.     As per HPI. Otherwise, a complete review of systems is negatve.  PAST MEDICAL HISTORY: Past Medical History:  Diagnosis Date  . Arthritis   . Breast cancer ([place]) 11/2010   radiation and lumpectomy  . Endometrial carcinoma ([place])   . Hypertension     PAST SURGICAL HISTORY: Past Surgical History:  Procedure Laterality Date  . ABDOMINAL HYSTERECTOMY    . BREAST BIOPSY Right 04/25/2013   negative, stereotactic  . BREAST BIOPSY Right 12/25/2010   positive, ultrasound core  . BREAST LUMPECTOMY Right 01/13/2011   positive    FAMILY  HISTORY Family History  Problem Relation Age of Onset  . Breast cancer Cousin 59    paternal  . Breast cancer Cousin 31    paternal       ADVANCED DIRECTIVES:    HEALTH MAINTENANCE: Social History  Substance Use Topics  . Smoking status: Never Smoker  . Smokeless tobacco: Never Used  . Alcohol use No     Colonoscopy:  PAP:  Bone density:  Lipid panel:  Allergies  Allergen Reactions  . Codeine Nausea Only  . Lisinopril Other (See Comments)    Blood pressure dropped to low 02/2010 on a higher dose    Current Outpatient Prescriptions  Medication Sig Dispense Refill  . atorvastatin (LIPITOR) 10 MG tablet   10  . buPROPion (WELLBUTRIN XL) 150 MG 24 hr tablet Take by mouth.    . Calcium Carbonate-Vitamin D 600-400 MG-UNIT per tablet Take by mouth.    . letrozole (FEMARA) 2.5 MG tablet TAKE 1 TABLET BY MOUTH ONCE DAILY 90 tablet 0  . metFORMIN (GLUCOPHAGE) 1000 MG tablet   0  . metoprolol succinate (TOPROL-XL) 25 MG 24 hr tablet   7  . ramipril (ALTACE) 1.25 MG capsule   10  . triamterene-hydrochlorothiazide (MAXZIDE-25) 37.5-25 MG per tablet Take by mouth.     No current facility-administered medications for this visit.     OBJECTIVE: There were no vitals filed for this visit.   There is no height or weight on file to calculate BMI.    ECOG FS:0 - Asymptomatic  General: Well-developed, well-nourished, no acute distress. Eyes: anicteric sclera. Breasts: Bilateral breasts and axilla without  lumps or masses. Lungs: Clear to auscultation bilaterally. Heart: Regular rate and rhythm. No rubs, murmurs, or gallops. Abdomen: Soft, nontender, nondistended. No organomegaly noted, normoactive bowel sounds. Musculoskeletal: No edema, cyanosis, or clubbing. Neuro: Alert, answering all questions appropriately. Cranial nerves grossly intact. Skin: No rashes or petechiae noted. Psych: Normal affect.   LAB RESULTS:  Lab Results  Component Value Date   NA 134 (L) 10/06/2012    K 3.6 10/06/2012   CL 97 (L) 10/06/2012   CO2 28 10/06/2012   GLUCOSE 248 (H) 10/06/2012   BUN 10 10/06/2012   CREATININE 0.62 10/06/2012   CALCIUM 8.5 10/06/2012   PROT 8.4 (H) 10/04/2012   ALBUMIN 2.2 (L) 10/04/2012   AST 19 10/04/2012   ALT 23 10/04/2012   ALKPHOS 136 10/04/2012   BILITOT 0.6 10/04/2012   GFRNONAA >60 10/06/2012   GFRAA >60 10/06/2012    Lab Results  Component Value Date   WBC 7.4 10/04/2012   NEUTROABS 3.6 07/31/2011   HGB 14.1 10/04/2012   HCT 41.6 10/04/2012   MCV 97 10/04/2012   PLT 325 10/09/2012     STUDIES: No results found.  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  PLAN:    1.  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  Continue letrozole daily for 5 years completing in January 2018.  Patient had an abnormal mammogram in October 2015, but biopsy was negative for malignancy. Patient will require repeat mammogram in the next 1-2 weeks. Return to clinic in 6 months for routine evaluation. 2. Postmenopausal: Bone mineral density completed on Nov 28, 2013 was reported as normal with a T score of -0.1. Repeat the next 1-2 weeks.   2.  Endometrial cancer: Patient has completed her XRT, monitor.  Continue followup with Gyn-Onc as needed.   Patient expressed understanding and was in agreement with this plan. She also understands that She can call clinic at any time with any questions, concerns, or complaints.   Breast cancer   Staging form: Breast, AJCC 7th Edition     Clinical stage from 01/01/2015: Stage IA (T1c, N0, M0) - Signed by Lloyd Huger, MD on 01/01/2015   Lloyd Huger, MD   09/24/2016 10:06 PM

## 2016-09-25 ENCOUNTER — Inpatient Hospital Stay: Payer: Medicare Other | Admitting: Oncology

## 2016-12-25 ENCOUNTER — Ambulatory Visit: Payer: Medicare Other | Admitting: Oncology

## 2017-11-30 IMAGING — US US BREAST*R* COMPLETE INC AXILLA
1 series · 13 of 16 positions shown · non-contrast
Comparison: Previous exam(s).

CLINICAL DATA: Status post right lumpectomy and radiation therapy
for breast cancer in 7187. The patient feels a hard, protuberant
mass inferior to the right axilla. She has felt this for the past 2
or 3 months. She has also had recent weight gain.

EXAM:
2D DIGITAL DIAGNOSTIC BILATERAL MAMMOGRAM WITH CAD AND ADJUNCT TOMO
ULTRASOUND RIGHT BREAST

[Series 1: us breast*right* complete inc axilla · 0.07mm/px · 13 of 16 slices shown]
[im 1/16]
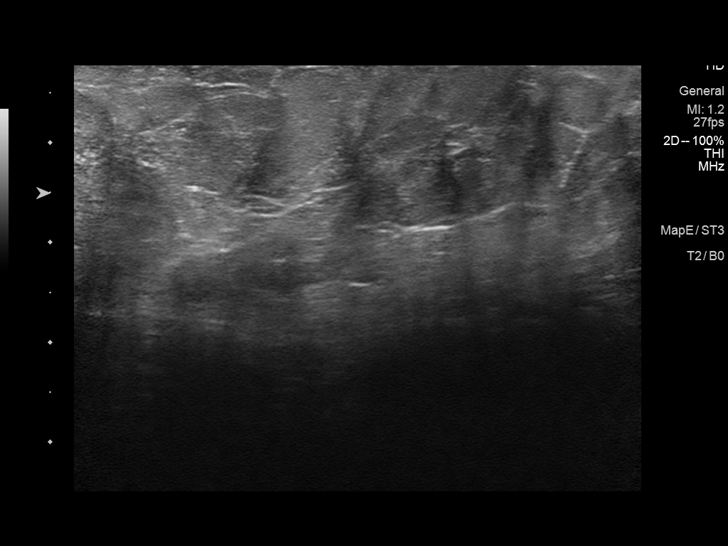
[im 2/16]
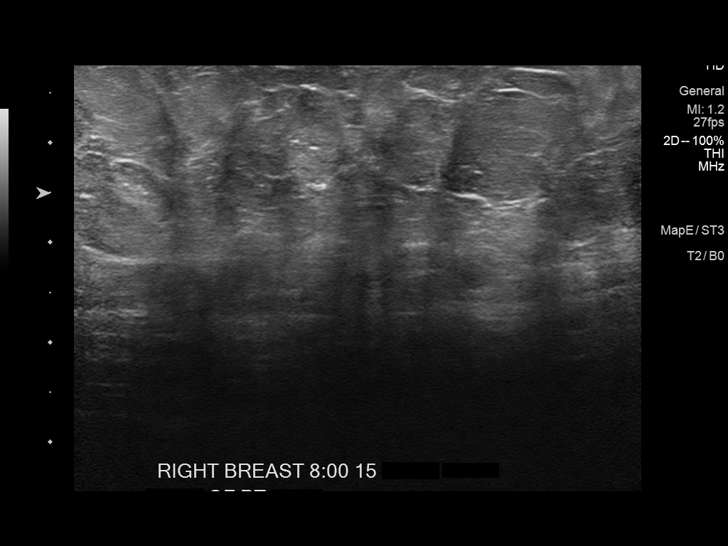
[im 4/16]
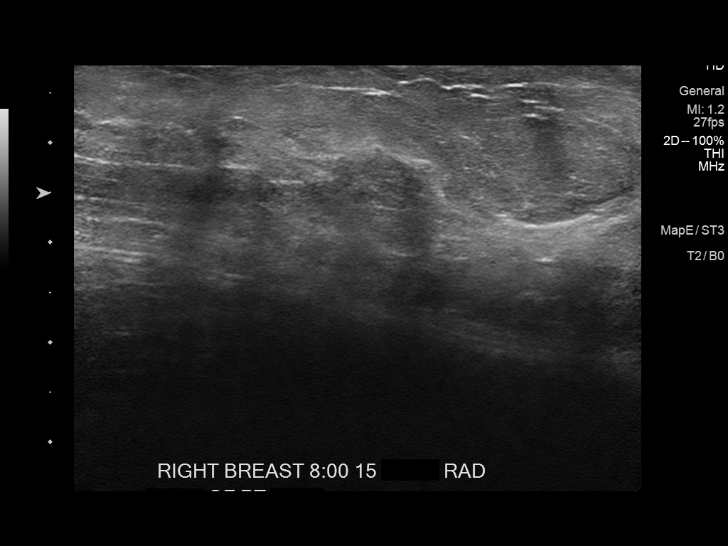
[im 5/16]
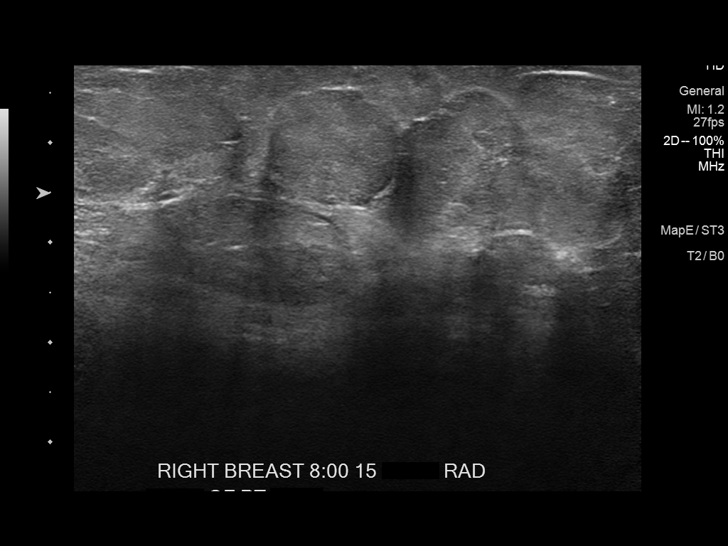
[im 6/16]
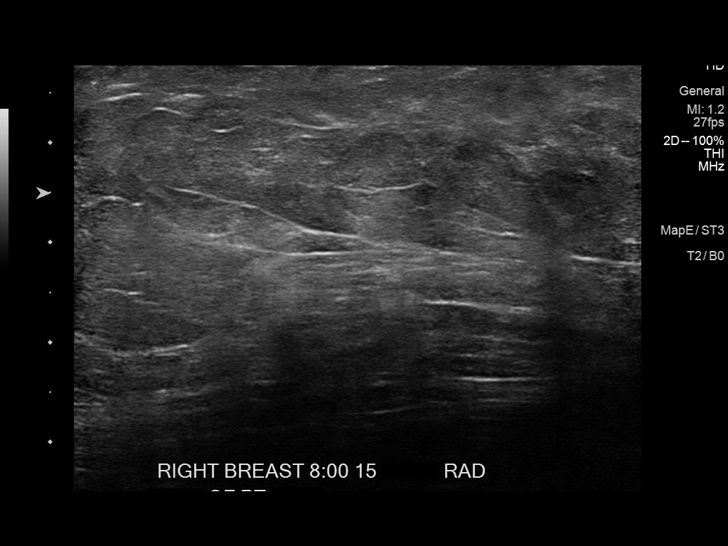
[im 7/16]
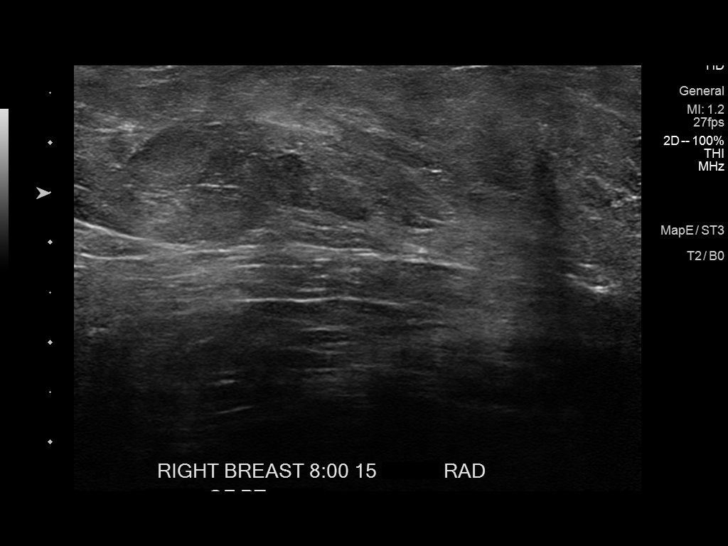
[im 9/16]
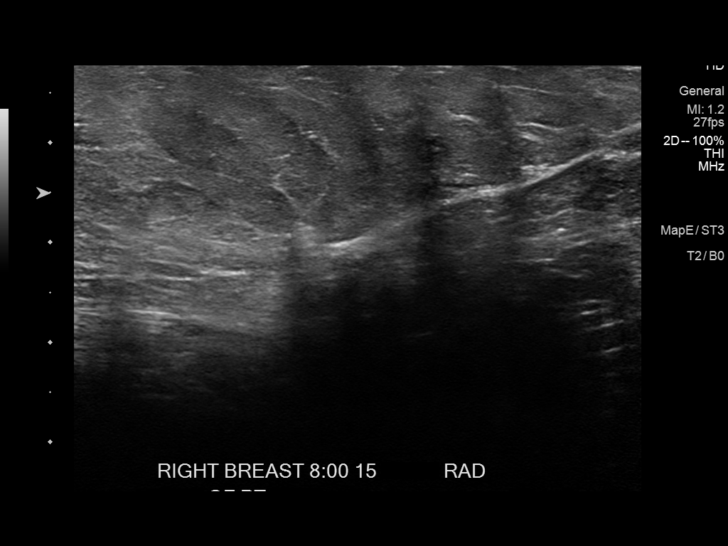
[im 10/16]
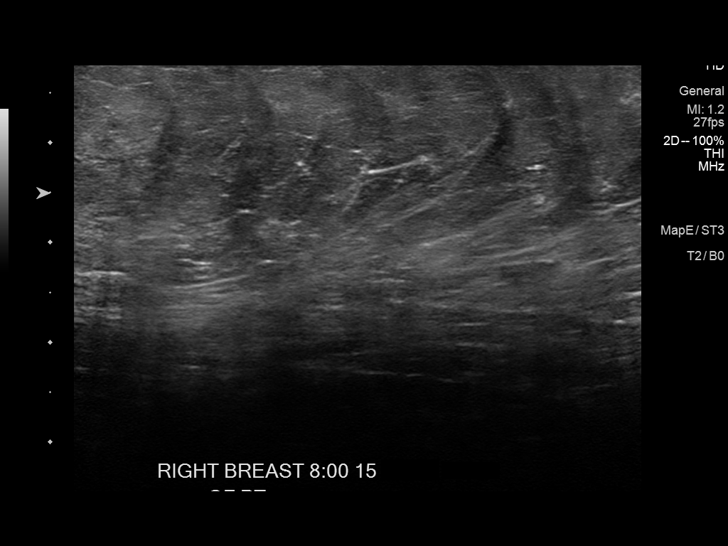
[im 11/16]
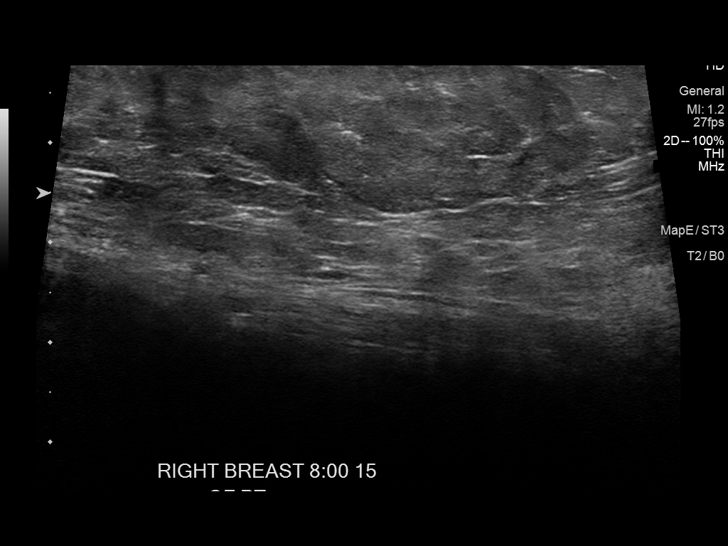
[im 12/16]
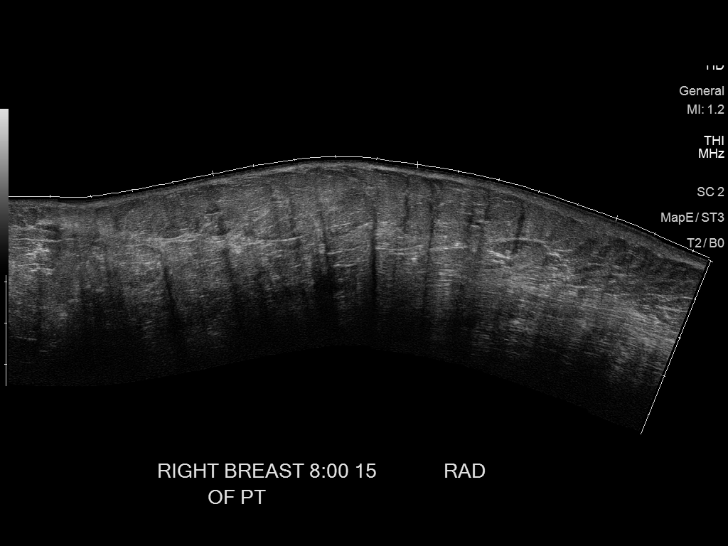
[im 13/16]
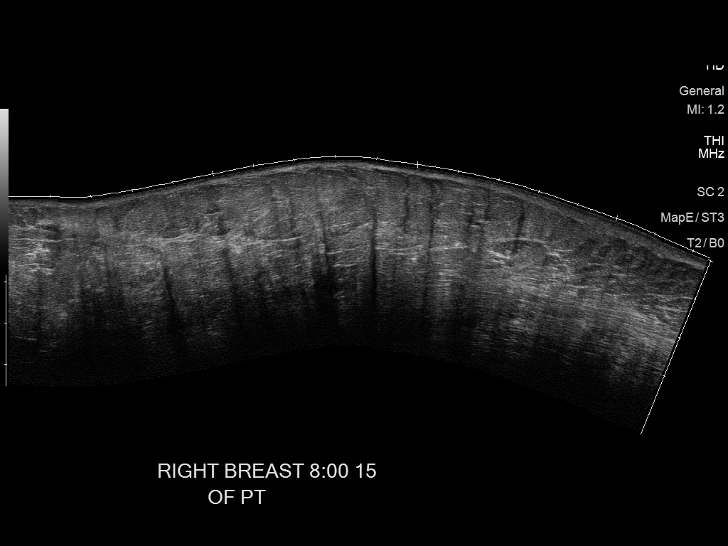
[im 15/16]
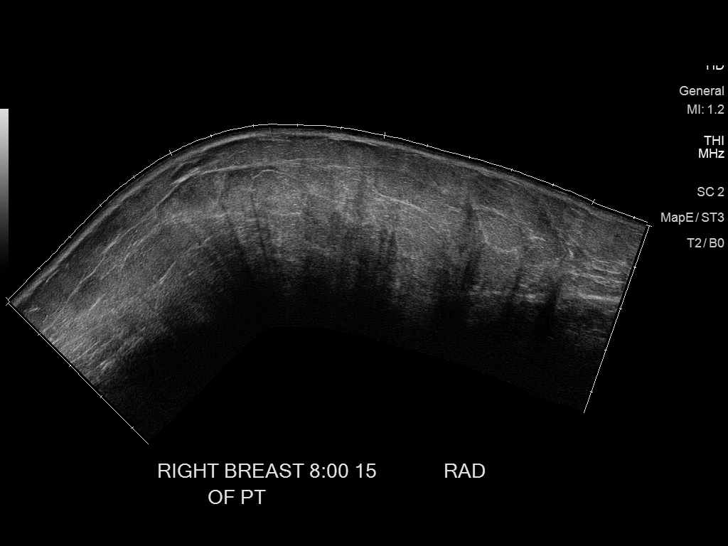
[im 16/16]
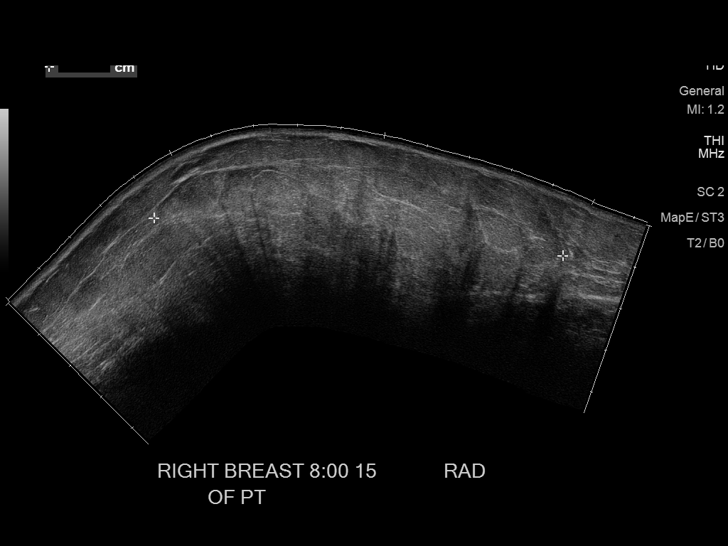

[13 of 16 positions shown; findings below may reference images not displayed]

ACR Breast Density Category b: There are scattered areas of
fibroglandular density.
FINDINGS: Post lumpectomy and postradiation changes are again demonstrated on
the right. No interval findings suspicious for malignancy in either
breast.

Mammographic images were processed with CAD.

On physical exam, the patient has an approximately 12 cm firm,
rounded, protuberant, palpable mass inferior to the right axilla.

Targeted ultrasound is performed, showing slightly heterogeneous and
slightly echogenic fatty tissue corresponding to the palpable mass
inferior to the right axilla. This is slightly more echogenic and
heterogeneous than the adjacent subcutaneous fat and measures
approximately 11.9 x 9.3 x 2.1 cm in maximum dimensions.
IMPRESSION: 1. Approximately 12 cm firm, palpable, protuberant fatty mass
inferior to the right axilla. This most likely represents a lipoma.
However, due to the firmness on palpation and mild heterogeneity,
there is a concern for the possibility of a liposarcoma.
2. No evidence of malignancy in either breast.

RECOMMENDATION:
1. Chest CT with contrast to evaluate the mass inferior to the right
axilla. The patient has severe claustrophobia and does not feel like
she can undergo an MRI of this area.
2. Bilateral diagnostic mammogram in 1 year.

I have discussed the findings and recommendations with the patient.
Results were also provided in writing at the conclusion of the
visit. If applicable, a reminder letter will be sent to the patient
regarding the next appointment.

BI-RADS CATEGORY  0: Incomplete. Need additional imaging evaluation
and/or prior mammograms for comparison.

## 2018-05-10 ENCOUNTER — Other Ambulatory Visit: Payer: Self-pay | Admitting: Family Medicine

## 2018-05-10 DIAGNOSIS — E119 Type 2 diabetes mellitus without complications: Secondary | ICD-10-CM

## 2018-05-10 DIAGNOSIS — Z794 Long term (current) use of insulin: Secondary | ICD-10-CM

## 2018-05-10 DIAGNOSIS — Z780 Asymptomatic menopausal state: Secondary | ICD-10-CM

## 2020-11-14 ENCOUNTER — Other Ambulatory Visit: Payer: Self-pay | Admitting: Family Medicine

## 2020-11-14 DIAGNOSIS — Z1231 Encounter for screening mammogram for malignant neoplasm of breast: Secondary | ICD-10-CM

## 2020-11-14 DIAGNOSIS — Z780 Asymptomatic menopausal state: Secondary | ICD-10-CM

## 2023-10-23 ENCOUNTER — Other Ambulatory Visit: Payer: Self-pay | Admitting: Family Medicine

## 2023-10-23 DIAGNOSIS — Z1231 Encounter for screening mammogram for malignant neoplasm of breast: Secondary | ICD-10-CM
